# Patient Record
Sex: Male | Born: 1995 | Race: Black or African American | Hispanic: No | Marital: Single | Smoking: Never smoker
Health system: Southern US, Community
[De-identification: ages and names within clinical notes are randomized; demographics above are authoritative.]

## PROBLEM LIST (undated history)

## (undated) HISTORY — PX: OTHER SURGICAL HISTORY: SHX169

---
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
## 2016-02-17 IMAGING — MR MR KNEE*L* W/O CM
4 of 7 series · 13 of 40 positions shown · non-contrast
Comparison: Radiographs from 06/05/2014

CLINICAL DATA: Left lateral knee pain after a football injury on
06/04/2014.

EXAM:
MRI OF THE LEFT KNEE WITHOUT CONTRAST
TECHNIQUE: Multiplanar, multisequence MR imaging of the knee was performed. No
intravenous contrast was administered.

[Series 3: pdfs axial · axial · 3.0mm · 0.19mm/px · z∈[-55,+21]mm · 3 of 33 slices shown]
[im 6/33]
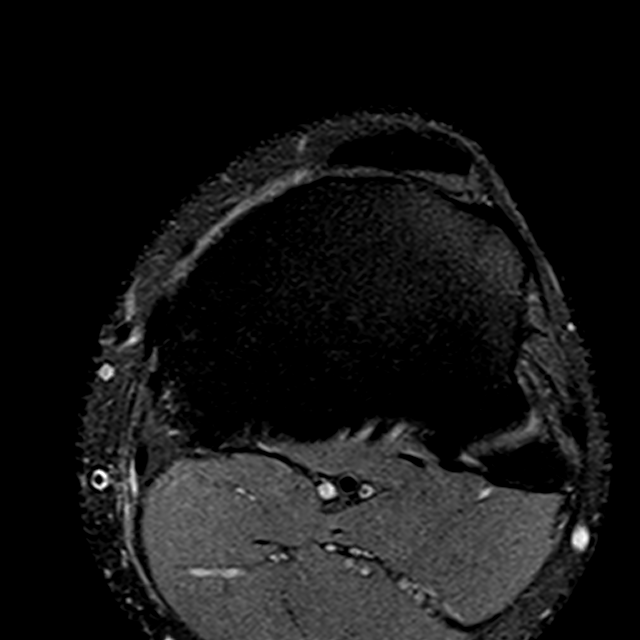
[im 17/33]
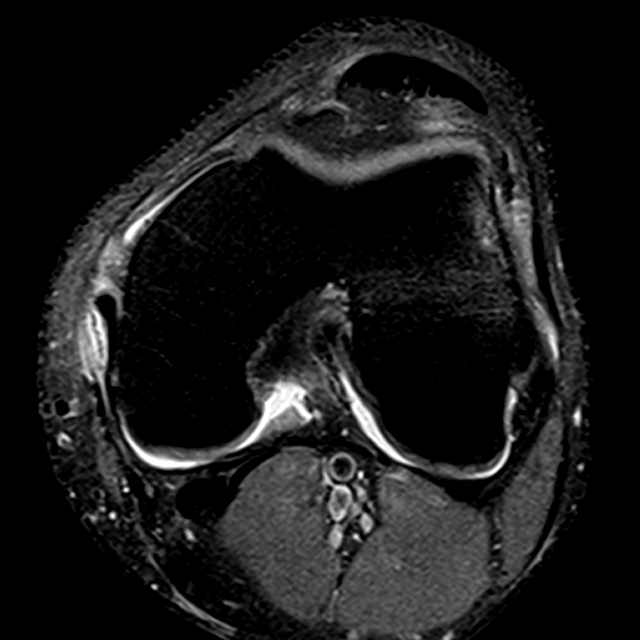
[im 27/33]
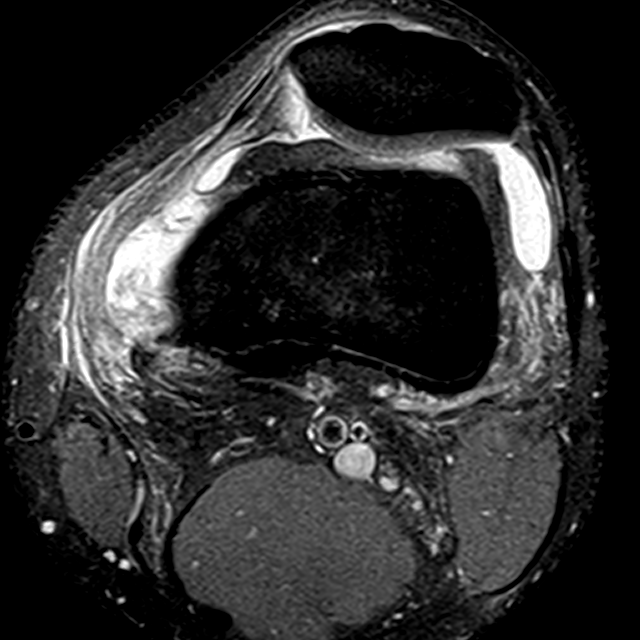

[Series 4: T1 · coronal · 3.0mm · 0.19mm/px · 4 of 27 slices shown]
[im 1/27]
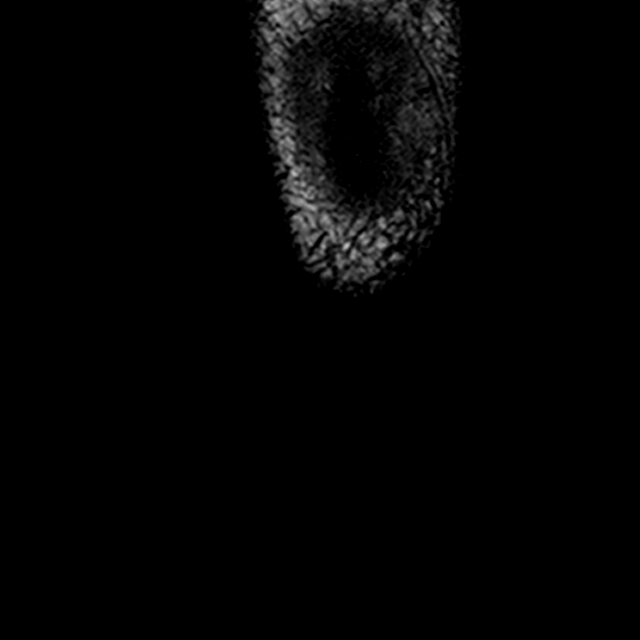
[im 6/27]
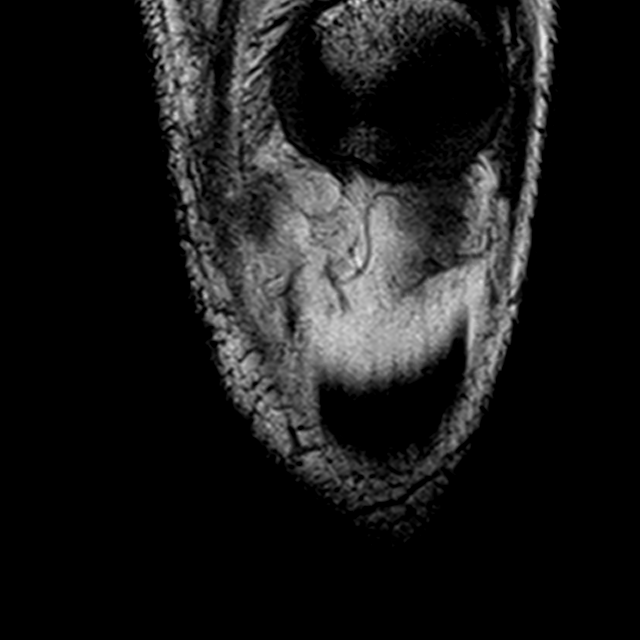
[im 16/27]
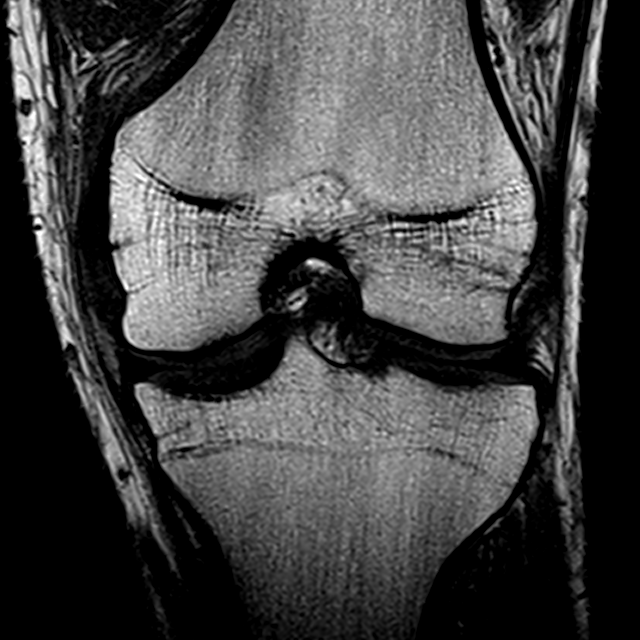
[im 27/27]
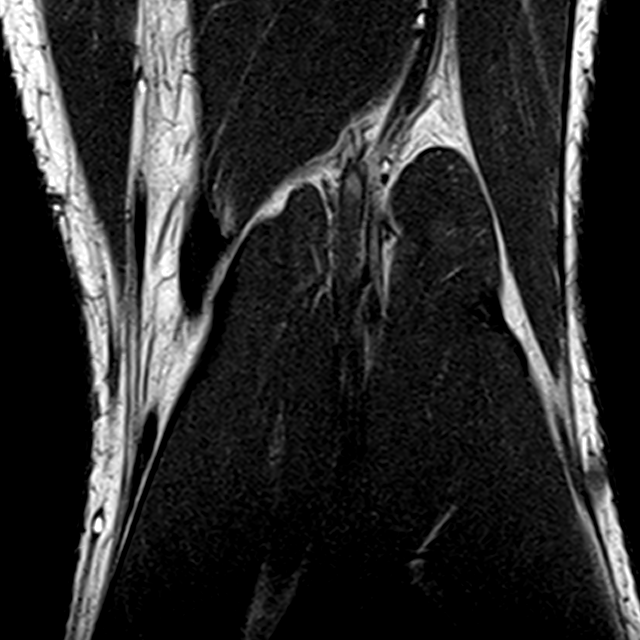

[Series 5: pdfs sag · sagittal · 3.0mm · 0.20mm/px · 3 of 29 slices shown]
[im 6/29]
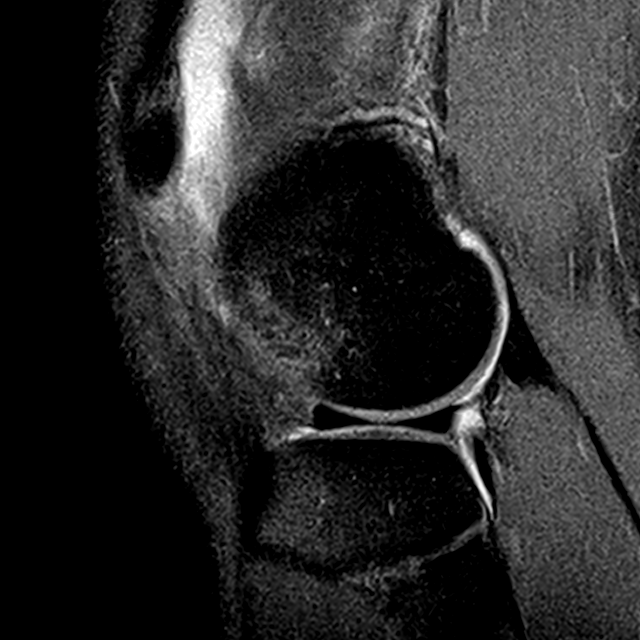
[im 17/29]
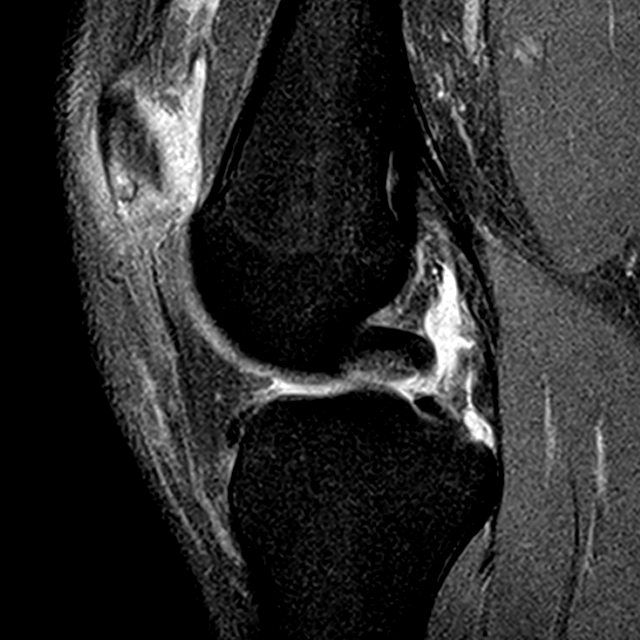
[im 29/29]
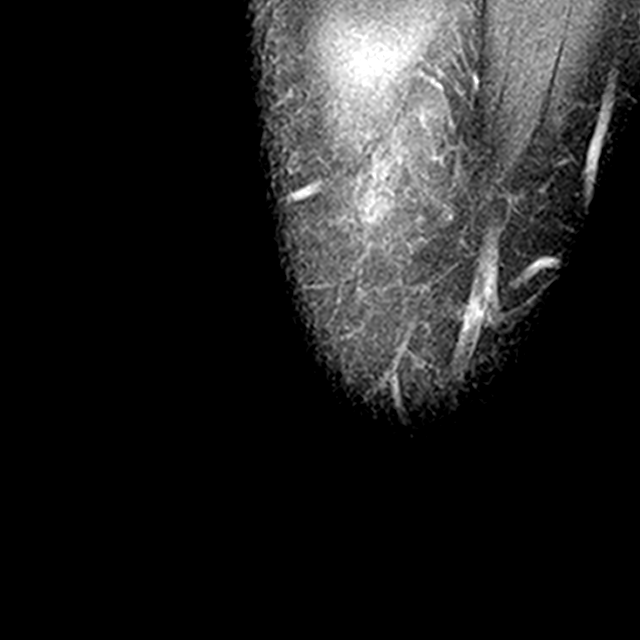

[Series 6: t2fs cor · coronal · 3.0mm · 0.21mm/px · 3 of 27 slices shown]
[im 6/27]
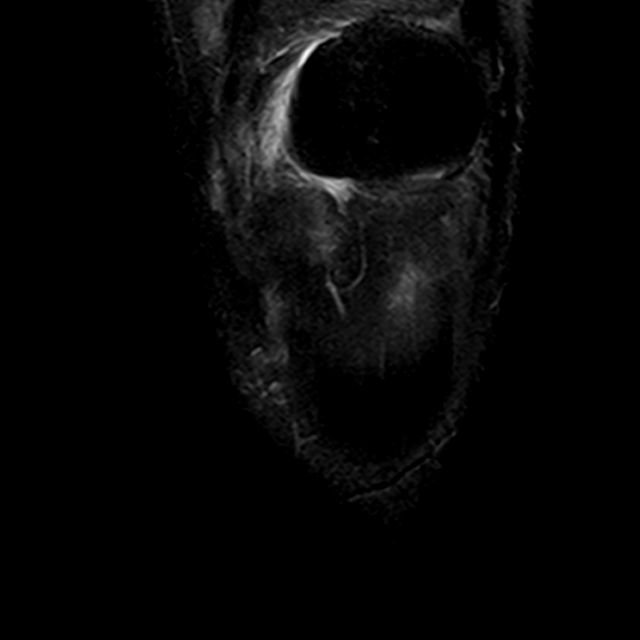
[im 16/27]
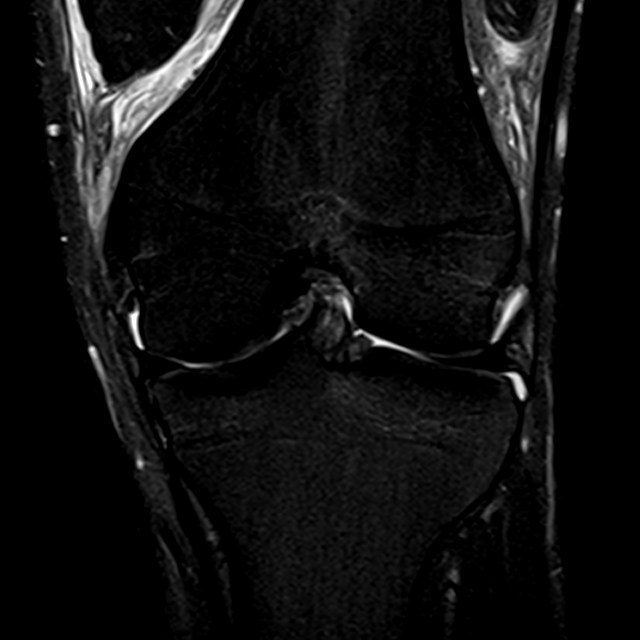
[im 27/27]
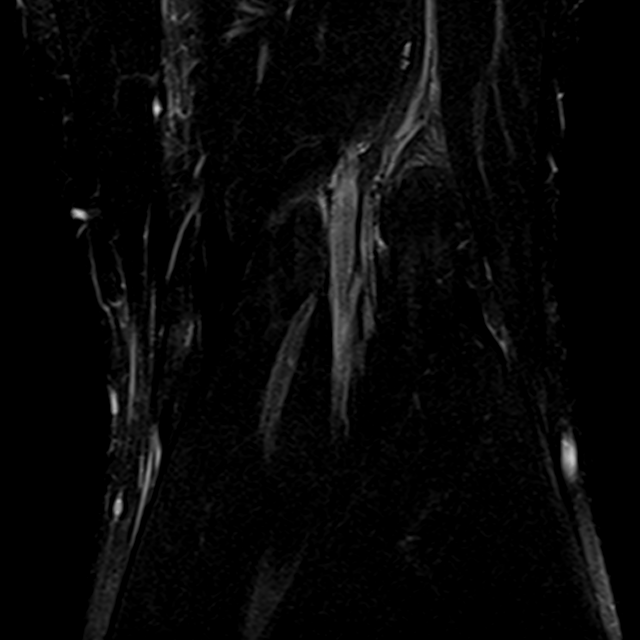

[13 of 40 positions shown; findings below may reference images not displayed]

FINDINGS: MENISCI

Medial meniscus:  Unremarkable

Lateral meniscus:  Unremarkable

LIGAMENTS

Cruciates:  Unremarkable

Collaterals:  Partially torn MCL proximally.

CARTILAGE

Patellofemoral: Tiny linear focus of chondromalacia along the
lateral patellar facet, image 9 series 3.

Medial:  Unremarkable

Lateral:  Unremarkable

Joint:  Small to moderate knee joint effusion.

Popliteal Fossa:  Unremarkable

Extensor Mechanism: Torn medial patellar retinaculum at the patellar
attachment. Torn medial patellofemoral ligament distally. The edema
surrounds the distal vastus medialis. Proximal patellar
tendinopathy.

Bones: Bone bruising along the anterolateral portion lateral femoral
condyle
IMPRESSION: 1. Findings compatible with prior transient patellar dislocation
with torn medial patellar retinaculum, torn medial patellofemoral
ligament, and bone bruising along the anterolateral portion of the
lateral femoral condyle. Knee effusion noted.
2. Partially torn MCL proximally.
3. Incidental tiny linear focus of chondromalacia along the lateral
patellar facet.
4. Mild proximal patellar tendinopathy.

## 2022-08-06 ENCOUNTER — Encounter: Payer: Self-pay | Admitting: Emergency Medicine

## 2022-08-06 ENCOUNTER — Ambulatory Visit
Admission: EM | Admit: 2022-08-06 | Discharge: 2022-08-06 | Disposition: A | Discharge status: Home / Self Care | Payer: 59 | Attending: Family Medicine | Admitting: Family Medicine

## 2022-08-06 DIAGNOSIS — J22 Unspecified acute lower respiratory infection: Secondary | ICD-10-CM

## 2022-08-06 MED ORDER — AMOXICILLIN-POT CLAVULANATE 875-125 MG PO TABS: 1.0000 | ORAL_TABLET | Freq: Two times a day (BID) | ORAL | 0 refills | Status: DC

## 2022-08-06 MED ORDER — ALBUTEROL SULFATE HFA 108 (90 BASE) MCG/ACT IN AERS: 2.0000 | INHALATION_SPRAY | RESPIRATORY_TRACT | 0 refills | Status: AC | PRN

## 2022-08-06 MED ORDER — PROMETHAZINE-DM 6.25-15 MG/5ML PO SYRP: 5.0000 mL | ORAL_SOLUTION | Freq: Four times a day (QID) | ORAL | 0 refills | Status: DC | PRN

## 2022-08-06 NOTE — ED Provider Notes (Signed)
[facility]    CSN: [id number] Arrival date & time: 08/06/22  1544      History   Chief Complaint Chief Complaint  Patient presents with   Chest Congestion   Dizziness    HPI Jason Stanley is a 27 y.o. male.   Presenting today with about a week and a half of nasal congestion, chest congestion, hoarseness, malaise.  Did an e-visit several days after onset and was given Astelin, Flonase and Tessalon Perles and felt like you started getting a bit better.  Started getting much worse the last few days again and his cough is now rattling and productive and he is feeling a bit short of breath.  Denies known history of asthma or seasonal allergies, no known sick contacts.    History reviewed. No pertinent past medical history.  Patient Active Problem List   Diagnosis Date Noted   Knee MCL sprain 06/14/2014   Recurrent dislocation of left patella 06/14/2014   Dislocated toe 01/06/2013   CLOSED FRACTURE OF METATARSAL BONE 05/05/2008    Past Surgical History:  Procedure Laterality Date   ingrown toe nail         Home Medications    Prior to Admission medications   Medication Sig Start Date End Date Taking? Authorizing Provider  albuterol (VENTOLIN HFA) 108 (90 Base) MCG/ACT inhaler Inhale 2 puffs into the lungs every 4 (four) hours as needed for wheezing or shortness of breath. 08/06/22  Yes Volney American, PA-C  amoxicillin-clavulanate (AUGMENTIN) 875-125 MG tablet Take 1 tablet by mouth every 12 (twelve) hours. 08/06/22  Yes Volney American, PA-C  promethazine-dextromethorphan (PROMETHAZINE-DM) 6.25-15 MG/5ML syrup Take 5 mLs by mouth 4 (four) times daily as needed. 08/06/22  Yes Volney American, PA-C  HYDROcodone-acetaminophen (NORCO/VICODIN) 5-325 MG per tablet Take one tab po q 4-6 hrs prn pain 08/12/14   Carole Civil, MD  ibuprofen (ADVIL,MOTRIN) 600 MG tablet Take 1 tablet (600 mg total) by mouth every 6 (six) hours as needed for pain.  12/30/12   Evalee Jefferson, PA-C  ibuprofen (ADVIL,MOTRIN) 600 MG tablet Take 1 tablet (600 mg total) by mouth every 6 (six) hours as needed. 06/05/14   Kem Parkinson, PA-C    Family History Family History  Problem Relation Age of Onset   Cancer Other    Diabetes Other     Social History Social History   Tobacco Use   Smoking status: Never  Substance Use Topics   Alcohol use: No   Drug use: No     Allergies   Patient has no known allergies.   Review of Systems Review of Systems Per HPI  Physical Exam Triage Vital Signs ED Triage Vitals  Enc Vitals Group     BP 08/06/22 1703 120/80     Pulse Rate 08/06/22 1703 68     Resp 08/06/22 1703 18     Temp 08/06/22 1703 98.1 F (36.7 C)     Temp Source 08/06/22 1703 Oral     SpO2 08/06/22 1703 95 %     Weight --      Height --      Head Circumference --      Peak Flow --      Pain Score 08/06/22 1704 0     Pain Loc --      Pain Edu? --      Excl. in [place]? --    No data found.  Updated Vital Signs BP 120/80 (BP Location:  Right Arm)   Pulse 68   Temp 98.1 F (36.7 C) (Oral)   Resp 18   SpO2 95%   Visual Acuity Right Eye Distance:   Left Eye Distance:   Bilateral Distance:    Right Eye Near:   Left Eye Near:    Bilateral Near:     Physical Exam Vitals and nursing note reviewed.  Constitutional:      Appearance: He is well-developed.  HENT:     Head: Atraumatic.     Right Ear: External ear normal.     Left Ear: External ear normal.     Nose: Congestion present.     Mouth/Throat:     Pharynx: Posterior oropharyngeal erythema present. No oropharyngeal exudate.  Eyes:     Conjunctiva/sclera: Conjunctivae normal.     Pupils: Pupils are equal, round, and reactive to light.  Cardiovascular:     Rate and Rhythm: Normal rate and regular rhythm.  Pulmonary:     Effort: Pulmonary effort is normal. No respiratory distress.     Breath sounds: Wheezing present. No rales.  Musculoskeletal:        General:  Normal range of motion.     Cervical back: Normal range of motion and neck supple.  Lymphadenopathy:     Cervical: No cervical adenopathy.  Skin:    General: Skin is warm and dry.  Neurological:     Mental Status: He is alert and oriented to person, place, and time.  Psychiatric:        Behavior: Behavior normal.      UC Treatments / Results  Labs (all labs ordered are listed, but only abnormal results are displayed) Labs Reviewed - No data to display  EKG   Radiology No results found.  Procedures Procedures (including critical care time)  Medications Ordered in UC Medications - No data to display  Initial Impression / Assessment and Plan / UC Course  I have reviewed the triage vital signs and the nursing notes.  Pertinent labs & imaging results that were available during my care of the patient were reviewed by me and considered in my medical decision making (see chart for details).     Given duration worsening course, will treat with Augmentin, Phenergan DM and add albuterol for wheezing and chest tightness.  Discussed supportive over-the-counter medications and home care additionally.  Return for worsening symptoms.  Work note given.  Final Clinical Impressions(s) / UC Diagnoses   Final diagnoses:  Lower respiratory infection   Discharge Instructions   None    ED Prescriptions     Medication Sig Dispense Auth. Provider   amoxicillin-clavulanate (AUGMENTIN) 875-125 MG tablet Take 1 tablet by mouth every 12 (twelve) hours. 14 tablet Volney American, [place]   promethazine-dextromethorphan (PROMETHAZINE-DM) 6.25-15 MG/5ML syrup Take 5 mLs by mouth 4 (four) times daily as needed. 100 mL Volney American, PA-C   albuterol (VENTOLIN HFA) 108 (90 Base) MCG/ACT inhaler Inhale 2 puffs into the lungs every 4 (four) hours as needed for wheezing or shortness of breath. 18 g Volney American, [place]      PDMP not reviewed this encounter.   Volney American, [place] 08/06/22 [phone]

## 2022-08-06 NOTE — ED Triage Notes (Signed)
Pt reports dizziness and chest congestion. States symptoms started Christmas day. Reports he was prescribed two nasal sprays and a cough suppressant from a previous e visit.

## 2023-06-17 ENCOUNTER — Ambulatory Visit
Admission: EM | Admit: 2023-06-17 | Discharge: 2023-06-17 | Disposition: A | Discharge status: Home / Self Care | Payer: Self-pay | Attending: Nurse Practitioner | Admitting: Nurse Practitioner

## 2023-06-17 DIAGNOSIS — J069 Acute upper respiratory infection, unspecified: Secondary | ICD-10-CM

## 2023-06-17 MED ORDER — BENZONATATE 100 MG PO CAPS: 200.0000 mg | ORAL_CAPSULE | Freq: Three times a day (TID) | ORAL | 0 refills | Status: AC | PRN

## 2023-06-17 MED ORDER — PROMETHAZINE-DM 6.25-15 MG/5ML PO SYRP: 5.0000 mL | ORAL_SOLUTION | Freq: Every evening | ORAL | 0 refills | Status: AC | PRN

## 2023-06-17 NOTE — ED Provider Notes (Signed)
[facility]    CSN: [id number] Arrival date & time: 06/17/23  1219      History   Chief Complaint No chief complaint on file.   HPI Jason Stanley is a 27 y.o. male.   Patient presents today with 3-day history of congested and dry cough, chest congestion, runny and stuffy nose, sore throat, headache, decreased appetite, and fatigue.  He denies fever, body aches or chills, shortness of breath or chest pain, ear pain, abdominal pain, nausea/vomiting, and diarrhea.  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  Has been taking over-the-counter Mucinex, Flonase, ibuprofen with improvement temporarily.  Is also trying to drink plenty of water.    History reviewed. No pertinent past medical history.  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 Authorizing Provider  benzonatate (TESSALON) 100 MG capsule Take 2 capsules (200 mg total) by mouth 3 (three) times daily as needed for cough. Do not take with alcohol or while driving or operating heavy machinery.  May cause drowsiness. 06/17/23  Yes Valentino Nose, NP  albuterol (VENTOLIN HFA) 108 (90 Base) MCG/ACT inhaler Inhale 2 puffs into the lungs every 4 (four) hours as needed for wheezing or shortness of breath. 08/06/22   Particia Nearing, PA-C  ibuprofen (ADVIL,MOTRIN) 600 MG tablet Take 1 tablet (600 mg total) by mouth every 6 (six) hours as needed for pain. 12/30/12   Burgess Amor, PA-C  ibuprofen (ADVIL,MOTRIN) 600 MG tablet Take 1 tablet (600 mg total) by mouth every 6 (six) hours as needed. 06/05/14   Triplett,  Tammy, PA-C  promethazine-dextromethorphan (PROMETHAZINE-DM) 6.25-15 MG/5ML syrup Take 5 mLs by mouth at bedtime as needed. Do not take with alcohol or while driving or operating heavy machinery.  May cause drowsiness. 06/17/23   Valentino Nose, NP    Family History Family History  Problem Relation Age of Onset   Cancer Other    Diabetes Other     Social History Social History   Tobacco Use   Smoking status: Never  Substance Use Topics   Alcohol use: No   Drug use: No     Allergies   Patient has no known allergies.   Review of Systems Review of Systems Per HPI  Physical Exam Triage Vital Signs ED Triage Vitals  Encounter Vitals Group     BP 06/17/23 1316 121/74     Systolic BP Percentile --      Diastolic BP Percentile --      Pulse Rate 06/17/23 1316 (!) 55     Resp 06/17/23 1316 18     Temp 06/17/23 1316 98.5 F (36.9 C)     Temp Source 06/17/23 1316 Oral     SpO2 06/17/23 1316 97 %     Weight --      Height --      Head Circumference --      Peak Flow --      Pain Score 06/17/23 1318 0     Pain Loc --  Pain Education --      Exclude from Growth Chart --    No data found.  Updated Vital Signs BP 121/74 (BP Location: Right Arm)   Pulse (!) 55   Temp 98.5 F (36.9 C) (Oral)   Resp 18   SpO2 97%   Visual Acuity Right Eye Distance:   Left Eye Distance:   Bilateral Distance:    Right Eye Near:   Left Eye Near:    Bilateral Near:     Physical Exam Vitals and nursing note reviewed.  Constitutional:      General: He is not in acute distress.    Appearance: Normal appearance. He is not ill-appearing or toxic-appearing.  HENT:     Head: Normocephalic and atraumatic.     Right Ear: Tympanic membrane, ear canal and external ear normal.     Left Ear: Tympanic membrane, ear canal and external ear normal.     Nose: Congestion present. No rhinorrhea.     Mouth/Throat:     Mouth: Mucous membranes are moist.     Pharynx: Oropharynx is clear.  No oropharyngeal exudate or posterior oropharyngeal erythema.  Eyes:     General: No scleral icterus.    Extraocular Movements: Extraocular movements intact.  Cardiovascular:     Rate and Rhythm: Normal rate and regular rhythm.  Pulmonary:     Effort: Pulmonary effort is normal. No respiratory distress.     Breath sounds: Normal breath sounds. No wheezing, rhonchi or rales.  Musculoskeletal:     Cervical back: Normal range of motion and neck supple.  Lymphadenopathy:     Cervical: No cervical adenopathy.  Skin:    General: Skin is warm and dry.     Coloration: Skin is not jaundiced or pale.     Findings: No erythema or rash.  Neurological:     Mental Status: He is alert and oriented to person, place, and time.  Psychiatric:        Behavior: Behavior is cooperative.      UC Treatments / Results  Labs (all labs ordered are listed, but only abnormal results are displayed) Labs Reviewed - No data to display  EKG   Radiology No results found.  Procedures Procedures (including critical care time)  Medications Ordered in UC Medications - No data to display  Initial Impression / Assessment and Plan / UC Course  I have reviewed the triage vital signs and the nursing notes.  Pertinent labs & imaging results that were available during my care of the patient were reviewed by me and considered in my medical decision making (see chart for details).   Patient is well-appearing, normotensive, afebrile, not tachycardic, not tachypneic, oxygenating well on room air.    1. Viral URI with cough Suspect viral etiology Vitals and exam are reassuring Viral testing deferred given length of symptoms Supportive care discussed with patient including cough suppressant medication ER and return precautions also discussed Note provided for work  The patient was given the opportunity to ask questions.  All questions answered to their satisfaction.  The patient is in agreement to this plan.     Final Clinical Impressions(s) / UC Diagnoses   Final diagnoses:  Viral URI with cough     Discharge Instructions      You have a viral upper respiratory infection.  Symptoms should improve over the next week to 10 days.  If you develop chest pain or shortness of breath, go to the emergency room.  Some things  that can make you feel better are: - Increased rest - Increasing fluid with water/sugar free electrolytes - Acetaminophen and ibuprofen as needed for fever/pain - Salt water gargling, chloraseptic spray and throat lozenges - OTC guaifenesin (Mucinex) 600 mg twice daily for congestion - Saline sinus flushes or a neti pot - Humidifying the air -Tessalon Perles every 8 hours as needed for dry cough and cough syrup at night time as needed for dry cough     ED Prescriptions     Medication Sig Dispense Auth. Provider   promethazine-dextromethorphan (PROMETHAZINE-DM) 6.25-15 MG/5ML syrup Take 5 mLs by mouth at bedtime as needed. Do not take with alcohol or while driving or operating heavy machinery.  May cause drowsiness. 118 mL Cathlean Marseilles A, NP   benzonatate (TESSALON) 100 MG capsule Take 2 capsules (200 mg total) by mouth 3 (three) times daily as needed for cough. Do not take with alcohol or while driving or operating heavy machinery.  May cause drowsiness. 30 capsule Valentino Nose, NP      PDMP not reviewed this encounter.   Valentino Nose, NP 06/17/23 [phone]

## 2023-06-17 NOTE — Discharge Instructions (Signed)
You have a viral upper respiratory infection.  Symptoms should improve over the next week to 10 days.  If you develop chest pain or shortness of breath, go to the emergency room.  Some things that can make you feel better are: - Increased rest - Increasing fluid with water/sugar free electrolytes - Acetaminophen and ibuprofen as needed for fever/pain - Salt water gargling, chloraseptic spray and throat lozenges - OTC guaifenesin (Mucinex) 600 mg twice daily for congestion - Saline sinus flushes or a neti pot - Humidifying the air -Tessalon Perles every 8 hours as needed for dry cough and cough syrup at night time as needed for dry cough

## 2023-06-17 NOTE — ED Triage Notes (Signed)
Pt c/o cough, congestion, sore throat, brownish color phlegm, cough is productive. X 1 week
